# Patient Record
Sex: Male | Born: 1970 | Race: Black or African American | Hispanic: No | Marital: Single | Smoking: Never smoker
Health system: Southern US, Community
[De-identification: ages and names within clinical notes are randomized; demographics above are authoritative.]

---

## 2006-10-25 ENCOUNTER — Encounter: Admission: RE | Admit: 2006-10-25 | Discharge: 2006-10-25 | Payer: Self-pay | Admitting: General Practice

## 2012-04-18 ENCOUNTER — Encounter (HOSPITAL_COMMUNITY): Payer: Self-pay | Admitting: *Deleted

## 2012-04-18 ENCOUNTER — Emergency Department (HOSPITAL_COMMUNITY)
Admission: EM | Admit: 2012-04-18 | Discharge: 2012-04-18 | Disposition: A | Discharge status: Home / Self Care | Payer: Self-pay | Attending: Emergency Medicine | Admitting: Emergency Medicine

## 2012-04-18 ENCOUNTER — Emergency Department (HOSPITAL_COMMUNITY): Payer: Self-pay

## 2012-04-18 DIAGNOSIS — R079 Chest pain, unspecified: Secondary | ICD-10-CM | POA: Insufficient documentation

## 2012-04-18 DIAGNOSIS — M25512 Pain in left shoulder: Secondary | ICD-10-CM

## 2012-04-18 DIAGNOSIS — M25519 Pain in unspecified shoulder: Secondary | ICD-10-CM | POA: Insufficient documentation

## 2012-04-18 DIAGNOSIS — R209 Unspecified disturbances of skin sensation: Secondary | ICD-10-CM | POA: Insufficient documentation

## 2012-04-18 LAB — POCT I-STAT, CHEM 8
BUN: 15 mg/dL (ref 6–23)
Calcium, Ion: 1.22 mmol/L (ref 1.12–1.23)
Chloride: 104 mEq/L (ref 96–112)
Glucose, Bld: 92 mg/dL (ref 70–99)
HCT: 45 % (ref 39.0–52.0)
Potassium: 3.9 mEq/L (ref 3.5–5.1)

## 2012-04-18 LAB — POCT I-STAT TROPONIN I: Troponin i, poc: 0 ng/mL (ref 0.00–0.08)

## 2012-04-18 MED ORDER — OXYCODONE-ACETAMINOPHEN 5-325 MG PO TABS
1.0000 | ORAL_TABLET | Freq: Once | ORAL | Status: AC
  Administered 2012-04-18: 1 via ORAL
  Filled 2012-04-18: qty 1

## 2012-04-18 NOTE — ED Notes (Signed)
Pt reports that numbness/tingling is worse at night when he is sleeping. States that at times "it feels paralyzed."

## 2012-04-18 NOTE — ED Provider Notes (Signed)
History     CSN: [id number]  Arrival date & time 04/18/12  1023   First MD Initiated Contact with Patient 04/18/12 1113      Chief Complaint  Patient presents with  . left arm / lateral chest pain     The history is provided by the patient.   patient reports one half years of ongoing constant left shoulder pain.  His pain is improved with the hydrocodone prescribed by his physician but when he does not take the pain medication his left shoulder continues to bother him.  Denies swelling of his left upper extremity.  No fevers or chills.  There is radiation of his pain from his left shoulder down his left arm.  No rash or erythema noted.  No fevers or chills.  The patient has not been seen or evaluated by an orthopedic surgeon.  He continues to have his ibuprofen and hydrocodone at home.  No dense imaging has ever been performed of his left shoulder.  He points to his left anterior deltoid when describing the pain the  History reviewed. No pertinent past medical history.  History reviewed. No pertinent past surgical history.  No family history on file.  History  Substance Use Topics  . Smoking status: Not on file  . Smokeless tobacco: Not on file  . Alcohol Use: Not on file      Review of Systems  All other systems reviewed and are negative.    Allergies  Review of patient's allergies indicates no known allergies.  Home Medications  No current outpatient prescriptions on file.  BP 127/83  Pulse 89  Temp 98.2 F (36.8 C) (Oral)  Resp 18  SpO2 98%  Physical Exam  Nursing note and vitals reviewed. Constitutional: He is oriented to person, place, and time. He appears well-developed and well-nourished.  HENT:  Head: Normocephalic and atraumatic.  Eyes: EOM are normal.  Neck: Normal range of motion.  Cardiovascular: Normal rate, regular rhythm, normal heart sounds and intact distal pulses.   Pulmonary/Chest: Effort normal and breath sounds normal. No respiratory  distress.  Abdominal: Soft. He exhibits no distension. There is no tenderness.  Musculoskeletal:       Pain with range of motion of left shoulder.  He has inability to abduct his left shoulder passed 180 without some discomfort.  Normal left radial pulse.  No swelling of his left upper extremity.  No rash or swelling noted.  Neurological: He is alert and oriented to person, place, and time.  Skin: Skin is warm and dry.  Psychiatric: He has a normal mood and affect. Judgment normal.    ED Course  Procedures (including critical care time)  Labs Reviewed - No data to display Dg Shoulder Left  04/18/2012  *RADIOLOGY REPORT*  Clinical Data: Left shoulder pain  LEFT SHOULDER - 2+ VIEW  Comparison: None.  Findings: No evidence for fracture.  No findings to suggest shoulder separation or dislocation. No worrisome lytic or sclerotic osseous lesion.  IMPRESSION: No findings to explain the patient's history of pain.   Original Report Authenticated By: Kennith Center, M.D.     I personally reviewed the imaging tests through PACS system I reviewed available ER/hospitalization records through the EMR    Date: 04/18/2012  Rate: 88  Rhythm: normal sinus rhythm  QRS Axis: normal  Intervals: normal  ST/T Wave abnormalities: normal  Conduction Disutrbances: none  Narrative Interpretation:   Old EKG Reviewed: No significant changes noted     1. 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  Left shoulder films pending.  Pain treated.  Orthopedic followup.  The suspicion for ACS is very low        Lyanne Co, MD 04/18/12 1248

## 2012-04-18 NOTE — ED Notes (Signed)
Pt states he has had this left arm pain with left lateral chest pain before and had bottle of pain medication and states that this pain is worse.

## 2012-04-18 NOTE — ED Notes (Signed)
MD Campos at bedside.  

## 2012-06-15 ENCOUNTER — Other Ambulatory Visit (HOSPITAL_COMMUNITY): Payer: Self-pay | Admitting: Orthopedic Surgery

## 2012-06-15 DIAGNOSIS — M542 Cervicalgia: Secondary | ICD-10-CM

## 2012-06-17 ENCOUNTER — Ambulatory Visit (HOSPITAL_COMMUNITY)
Admission: RE | Admit: 2012-06-17 | Discharge: 2012-06-17 | Disposition: A | Discharge status: Home / Self Care | Payer: Self-pay | Source: Ambulatory Visit | Attending: Orthopedic Surgery | Admitting: Orthopedic Surgery

## 2012-06-17 DIAGNOSIS — M5030 Other cervical disc degeneration, unspecified cervical region: Secondary | ICD-10-CM | POA: Insufficient documentation

## 2012-06-17 DIAGNOSIS — M542 Cervicalgia: Secondary | ICD-10-CM

## 2012-06-17 DIAGNOSIS — M25519 Pain in unspecified shoulder: Secondary | ICD-10-CM | POA: Insufficient documentation

## 2012-06-17 DIAGNOSIS — M47812 Spondylosis without myelopathy or radiculopathy, cervical region: Secondary | ICD-10-CM | POA: Insufficient documentation

## 2012-07-14 DIAGNOSIS — M542 Cervicalgia: Secondary | ICD-10-CM | POA: Insufficient documentation

## 2013-07-24 ENCOUNTER — Ambulatory Visit: Payer: No Typology Code available for payment source | Attending: Internal Medicine

## 2013-07-27 ENCOUNTER — Ambulatory Visit (INDEPENDENT_AMBULATORY_CARE_PROVIDER_SITE_OTHER): Payer: Self-pay | Admitting: General Surgery

## 2013-08-10 ENCOUNTER — Encounter (INDEPENDENT_AMBULATORY_CARE_PROVIDER_SITE_OTHER): Payer: Self-pay | Admitting: General Surgery

## 2013-08-10 ENCOUNTER — Ambulatory Visit (INDEPENDENT_AMBULATORY_CARE_PROVIDER_SITE_OTHER): Payer: Self-pay | Admitting: General Surgery

## 2013-08-10 VITALS — BP 106/70 | HR 72 | Temp 97.7°F | Resp 16 | Ht 65.0 in | Wt 165.6 lb

## 2013-08-10 DIAGNOSIS — N4340 Spermatocele of epididymis, unspecified: Secondary | ICD-10-CM

## 2013-08-10 NOTE — Progress Notes (Signed)
Patient ID: Nathaniel Cruz, male   DOB: 1970-05-15, 43 y.o.   MRN: [id number]  Chief Complaint  Patient presents with  . New Evaluation    eval [facility]    HPI Nathaniel Cruz is a 43 y.o. male.  He is referred by Dr. Andi Devon for evaluation and management of a left scrotal mass, thought to be a hernia.  The patient states that he has had a lump in his left scrotum for 5 years. It has gotten larger and is now more painful. It never goes away. No prior history of hernia. No history of surgical problems in the past. No medical problems. Basically is healthy.  HPI  History reviewed. No pertinent past medical history.  History reviewed. No pertinent past surgical history.  No family history on file.  Social History History  Substance Use Topics  . Smoking status: Not on file  . Smokeless tobacco: Not on file  . Alcohol Use: Not on file    No Known Allergies  No current outpatient prescriptions on file.   No current facility-administered medications for this visit.    Review of Systems Review of Systems  Constitutional: Negative for fever, chills and unexpected weight change.  HENT: Negative for congestion, hearing loss, sore throat, trouble swallowing and voice change.   Eyes: Negative for visual disturbance.  Respiratory: Negative for cough and wheezing.   Cardiovascular: Negative for chest pain, palpitations and leg swelling.  Gastrointestinal: Negative for nausea, vomiting, abdominal pain, diarrhea, constipation, blood in stool, abdominal distention, anal bleeding and rectal pain.  Genitourinary: Positive for scrotal swelling. Negative for hematuria and difficulty urinating.  Musculoskeletal: Negative for arthralgias.  Skin: Negative for rash and wound.  Neurological: Negative for seizures, syncope, weakness and headaches.  Hematological: Negative for adenopathy. Does not bruise/bleed easily.  Psychiatric/Behavioral: Negative for confusion.    Blood pressure 106/70, pulse 72,  temperature 97.7 F (36.5 C), temperature source Temporal, resp. rate 16, height 5\' 5"  (1.651 m), weight 165 lb 9.6 oz (75.116 kg).  Physical Exam Physical Exam  Constitutional: He is oriented to person, place, and time. He appears well-developed and well-nourished. No distress.  HENT:  Head: Normocephalic.  Nose: Nose normal.  Mouth/Throat: No oropharyngeal exudate.  Eyes: Conjunctivae and EOM are normal. Pupils are equal, round, and reactive to light. Right eye exhibits no discharge. Left eye exhibits no discharge. No scleral icterus.  Neck: Normal range of motion. Neck supple. No JVD present. No tracheal deviation present. No thyromegaly present.  Cardiovascular: Normal rate, regular rhythm, normal heart sounds and intact distal pulses.   No murmur heard. Pulmonary/Chest: Effort normal and breath sounds normal. No stridor. No respiratory distress. He has no wheezes. He has no rales. He exhibits no tenderness.  Abdominal: Soft. Bowel sounds are normal. He exhibits no distension and no mass. There is no tenderness. There is no rebound and no guarding.  Genitourinary:  There is a 2 cm mass associated with the cord structures above the testicle. This is mobile and separate from the testicle. It is firm and consistent with a spermatocele. I do not feel an obvious inguinal hernia when standing. Right side feels normal.  Musculoskeletal: Normal range of motion. He exhibits no edema and no tenderness.  Lymphadenopathy:    He has no cervical adenopathy.  Neurological: He is alert and oriented to person, place, and time. He has normal reflexes. Coordination normal.  Skin: Skin is warm and dry. No rash noted. He is not diaphoretic. No erythema. No pallor.  Psychiatric: He has a normal mood and affect. His behavior is normal. Judgment and thought content normal.    Data Reviewed Office notes from Dr. Andi Devonloward  Assessment    Left spermatocele     Plan    Referred to urology for ultrasound  and management decisions.        Nathaniel MentionHaywood M Sesar Cruz 08/10/2013, 11:17 AM

## 2013-08-10 NOTE — Patient Instructions (Signed)
The lump in your left scrotum is most likely a benign tumor called a spermatocele.  Your testicle, is probably completely normal.  I do not feel an obvious hernia.  You'll be referred to a urologist for a scrotal ultrasound. They will decide whether you need an operation or not.    Scrotal Masses Scrotal swelling is common in men of all ages. Common types of testicular masses include:   Hydrocele. The most common benign testicular mass in an adult. Hydroceles are generally soft and painless collections of fluid in the scrotal sac. These can rapidly change size as the fluid enters or leaves. Hydroceles can be associated with an underlying cancer of the testicle.  Spermatoceles. Generally soft and painless cyst-like masses in the scrotum that contain fluid, usually above the testicle. They can rapidly change size as the fluid enters or leaves. They are more prominent while standing or exercising. Sometimes, spermatoceles may cause a sensation of heaviness or a dull ache.  Orchitis. Inflammation of the testicle. It is painful and may be associated with a fever or symptoms of a urinary tract infection, including frequent and painful urination. It is common in males who have the mumps.  Varicocele. An enlargement of the veins that drain the testicles. Varicoceles usually occur on the left side of the scrotum. This condition can increase the risk of infertility. Varicocele is sometimes more prominent while standing or exercising. Sometimes, varicoceles may cause a sensation of heaviness or a dull ache.  Inguinal hernia. A bulge caused by a portion of intestine protruding into the scrotum through a weak area in the abdominal muscles. Hernias may or may not be painful. They are soft and usually enlarge with coughing or straining.  Torsion of the testis. This can cause a testicular mass that develops quickly and is associated with tenderness or fever, or both. It is caused by a twisting of the testicle  within the sac. It also reduces the blood supply and can destroy the testis if not treated quickly with surgery.  Epididymitis. Inflammation of the epididymis (a structure attached above and behind the testicle), usually caused by a urinary tract infection or a sexually transmitted infection. This generally shows up as testicular discomfort and swelling and may include pain during urination. It is frequently associated with a testicle infection.  Testicular appendages. Remnants of tissue on the testis present since birth. A testicular appendage can twist on its blood supply and cause pain. In most cases, this is seen as a blue dot on the scrotum.  Hematocele. A collection of blood between the layers of the sac inside the scrotum. It usually is caused by trauma to the scrotum.  Sebaceous cysts. These can be a swelling in the skin of the scrotum and are usually painless.  Cancer (carcinoma) of the skin of the scrotum. It can cause scrotal swelling, but this is rare. Document Released: 09/06/2002 Document Revised: 11/02/2012 Document Reviewed: 08/22/2012 [facility] Patient Information 2014 [place], [place].

## 2013-08-11 ENCOUNTER — Ambulatory Visit: Payer: No Typology Code available for payment source | Attending: Internal Medicine

## 2013-09-18 ENCOUNTER — Encounter (INDEPENDENT_AMBULATORY_CARE_PROVIDER_SITE_OTHER): Payer: Self-pay

## 2013-09-21 ENCOUNTER — Ambulatory Visit (INDEPENDENT_AMBULATORY_CARE_PROVIDER_SITE_OTHER): Payer: No Typology Code available for payment source | Admitting: Internal Medicine

## 2013-09-21 ENCOUNTER — Encounter: Payer: Self-pay | Admitting: Internal Medicine

## 2013-09-21 VITALS — BP 108/72 | HR 60 | Temp 98.0°F | Resp 20 | Ht 65.0 in | Wt 160.0 lb

## 2013-09-21 DIAGNOSIS — R52 Pain, unspecified: Secondary | ICD-10-CM

## 2013-09-21 DIAGNOSIS — Z0000 Encounter for general adult medical examination without abnormal findings: Secondary | ICD-10-CM

## 2013-09-21 DIAGNOSIS — N4340 Spermatocele of epididymis, unspecified: Secondary | ICD-10-CM

## 2013-09-21 MED ORDER — OXYCODONE HCL 5 MG PO CAPS: 5.0000 mg | ORAL_CAPSULE | ORAL | Status: DC | PRN

## 2013-09-22 ENCOUNTER — Telehealth (HOSPITAL_COMMUNITY): Payer: Self-pay | Admitting: *Deleted

## 2013-09-22 LAB — CBC WITH DIFFERENTIAL/PLATELET
BASOS PCT: 0 % (ref 0–1)
Basophils Absolute: 0 10*3/uL (ref 0.0–0.1)
EOS ABS: 0.2 10*3/uL (ref 0.0–0.7)
EOS PCT: 4 % (ref 0–5)
HEMATOCRIT: 40 % (ref 39.0–52.0)
HEMOGLOBIN: 13.9 g/dL (ref 13.0–17.0)
Lymphocytes Relative: 57 % — ABNORMAL HIGH (ref 12–46)
Lymphs Abs: 2.3 10*3/uL (ref 0.7–4.0)
MCH: 27.7 pg (ref 26.0–34.0)
MCHC: 34.8 g/dL (ref 30.0–36.0)
MCV: 79.7 fL (ref 78.0–100.0)
MONO ABS: 0.4 10*3/uL (ref 0.1–1.0)
MONOS PCT: 9 % (ref 3–12)
Neutro Abs: 1.2 10*3/uL — ABNORMAL LOW (ref 1.7–7.7)
Neutrophils Relative %: 30 % — ABNORMAL LOW (ref 43–77)
Platelets: 173 10*3/uL (ref 150–400)
RBC: 5.02 MIL/uL (ref 4.22–5.81)
RDW: 13.9 % (ref 11.5–15.5)
WBC: 4.1 10*3/uL (ref 4.0–10.5)

## 2013-09-22 LAB — COMPREHENSIVE METABOLIC PANEL
ALK PHOS: 68 U/L (ref 39–117)
ALT: 18 U/L (ref 0–53)
AST: 21 U/L (ref 0–37)
Albumin: 4.2 g/dL (ref 3.5–5.2)
BILIRUBIN TOTAL: 0.9 mg/dL (ref 0.2–1.2)
BUN: 9 mg/dL (ref 6–23)
CO2: 26 meq/L (ref 19–32)
Calcium: 9.2 mg/dL (ref 8.4–10.5)
Chloride: 105 mEq/L (ref 96–112)
Creat: 0.94 mg/dL (ref 0.50–1.35)
GLUCOSE: 99 mg/dL (ref 70–99)
Potassium: 4.7 mEq/L (ref 3.5–5.3)
Sodium: 138 mEq/L (ref 135–145)
Total Protein: 7.1 g/dL (ref 6.0–8.3)

## 2013-09-22 LAB — URINALYSIS
BILIRUBIN URINE: NEGATIVE
GLUCOSE, UA: NEGATIVE mg/dL
HGB URINE DIPSTICK: NEGATIVE
KETONES UR: NEGATIVE mg/dL
Leukocytes, UA: NEGATIVE
NITRITE: NEGATIVE
PH: 8 (ref 5.0–8.0)
Protein, ur: NEGATIVE mg/dL
Specific Gravity, Urine: 1.02 (ref 1.005–1.030)
Urobilinogen, UA: 1 mg/dL (ref 0.0–1.0)

## 2013-09-22 LAB — LIPID PANEL
CHOLESTEROL: 161 mg/dL (ref 0–200)
HDL: 48 mg/dL (ref >39)
LDL CALC: 102 mg/dL — AB (ref 0–99)
TRIGLYCERIDES: 56 mg/dL (ref <150)
Total CHOL/HDL Ratio: 3.4 Ratio
VLDL: 11 mg/dL (ref 0–40)

## 2013-09-22 LAB — VITAMIN D 25 HYDROXY (VIT D DEFICIENCY, FRACTURES): Vit D, 25-Hydroxy: 29 ng/mL — ABNORMAL LOW (ref 30–89)

## 2013-09-22 LAB — TSH: TSH: 0.385 u[IU]/mL (ref 0.350–4.500)

## 2013-09-22 NOTE — Telephone Encounter (Addendum)
Patient presents to sickle cell clinic with questions concerning his medication. Patient has oxycodone 5 mg capsules and patient inquiring if this is the correct medication as it looks different. Also per patient, MD instruction was to take half a pill every 4 hours as needed. Notified MD. Per MD, Patient is to take 1 capsule every 6-8 hours as needed. Instructions given to patient, patient verbalizes understanding using teach back method. Informed patient if need to verify if the pill is the correct medication to clarify with his pharmacy. Patient acknowledges.

## 2013-10-02 ENCOUNTER — Telehealth: Payer: Self-pay | Admitting: Internal Medicine

## 2013-10-02 ENCOUNTER — Ambulatory Visit (INDEPENDENT_AMBULATORY_CARE_PROVIDER_SITE_OTHER): Payer: No Typology Code available for payment source | Admitting: Family Medicine

## 2013-10-02 ENCOUNTER — Encounter: Payer: Self-pay | Admitting: Family Medicine

## 2013-10-02 ENCOUNTER — Telehealth: Payer: Self-pay | Admitting: Family Medicine

## 2013-10-02 VITALS — BP 111/74 | HR 73 | Temp 98.5°F | Resp 16 | Ht 65.0 in | Wt 161.0 lb

## 2013-10-02 DIAGNOSIS — R52 Pain, unspecified: Secondary | ICD-10-CM

## 2013-10-02 DIAGNOSIS — N433 Hydrocele, unspecified: Secondary | ICD-10-CM

## 2013-10-02 DIAGNOSIS — N4340 Spermatocele of epididymis, unspecified: Secondary | ICD-10-CM

## 2013-10-02 DIAGNOSIS — R1032 Left lower quadrant pain: Secondary | ICD-10-CM | POA: Insufficient documentation

## 2013-10-02 DIAGNOSIS — R109 Unspecified abdominal pain: Secondary | ICD-10-CM

## 2013-10-02 MED ORDER — IBUPROFEN 600 MG PO TABS: 600.0000 mg | ORAL_TABLET | Freq: Three times a day (TID) | ORAL | Status: DC | PRN

## 2013-10-02 MED ORDER — OXYCODONE HCL 5 MG PO CAPS: 5.0000 mg | ORAL_CAPSULE | Freq: Four times a day (QID) | ORAL | Status: DC | PRN

## 2013-10-02 NOTE — Patient Instructions (Addendum)
Take Ibuprofen 600 every 8 hours as needed for mild to moderate pain. Take with food!!! Start Oxycodone 5 mg every 6 hours as needed for moderate to severe pain.  Start OTC Vitamin D Supplement 1000 IU daily Recommend athletic scrotal support or "jock strap"

## 2013-10-02 NOTE — Telephone Encounter (Signed)
Scheduled work-in appointment for [facility] on 10/03/2013 at 9am.  [facility] is symptoms worsen.  Patient reports that pain intensity has decreased after starting Ibuprofen 600 mg this am. Patient to follow up with Dr. Ashley RoyaltyMatthews as scheduled

## 2013-10-02 NOTE — Telephone Encounter (Signed)
Patient walked in to office today to advise the pain medication is not working. Unable to sleep last night.

## 2013-10-02 NOTE — Progress Notes (Signed)
Subjective:    Patient ID: Nathaniel Cruz, male    DOB: [birth year], 43 y.o.   MRN: [id number]  HPI Patient was in the office to establish care on 09/21/2013.   Patient presents for evaluation of left testicle. Patient notified the office of worsening testicular pain. He reports an 8/10 dull pain to left testicle. He reports that he has had testicular pain and discomfort for 4 years. Reports that he has been evaluated for this condition on several occasions. He states that he was evaluated by Dr. Derrell Lolling in May, who referred him to [facility]. Patient was evaluated by Dr. Margarita Grizzle, who suggested surgical intervention for a hydrocele of the left spermatic cord. Patient was not interested in surgical intervention at that time. Patient states that symptoms are worsening and the pain medication is not working as well. He last had Oxycodone 5 mg on 7/19 around 6 pm with minimal relief. Patient denies strenuous activities, changing sex partners, or extreme exercising. . Pt has no symptoms of chronic constipation, chronic cough. Reports urinary frequency and urgency. Patient denies a history of groin surgery or hernia.    Review of Systems  Constitutional: Positive for fatigue.  HENT: Negative.   Eyes: Negative.   Respiratory: Negative.   Cardiovascular: Negative.   Gastrointestinal: Negative.   Endocrine: Negative.   Genitourinary: Positive for testicular pain.  Skin: Negative.   Allergic/Immunologic: Negative.   Neurological: Negative.   Hematological: Negative.   Psychiatric/Behavioral: Negative.        Objective:   Physical Exam  Constitutional: He is oriented to person, place, and time. He appears well-developed and well-nourished.  HENT:  Head: Normocephalic.  Right Ear: External ear normal.  Eyes: Conjunctivae and lids are normal. Pupils are equal, round, and reactive to light.  Cardiovascular: Normal rate, regular rhythm and normal heart sounds.   Pulmonary/Chest: Effort normal  and breath sounds normal.  Abdominal: Soft. Bowel sounds are normal. There is tenderness in the right lower quadrant and left lower quadrant. There is no CVA tenderness. A hernia is present.  Genitourinary:    Right testis shows mass. Left testis shows swelling and tenderness. Cremasteric reflex is not absent on the left side.  Musculoskeletal: He exhibits edema.  Neurological: He is alert and oriented to person, place, and time. He has normal reflexes.  Skin: Skin is warm and dry.  Psychiatric: He has a normal mood and affect. His behavior is normal. Thought content normal.         BP 111/74  Pulse 73  Temp(Src) 98.5 F (36.9 C) (Oral)  Resp 16  Ht 5\' 5"  (1.651 m)  Wt 161 lb (73.029 kg)  BMI 26.79 kg/m2 Assessment & Plan:  1. Left testicular pain: Patient reports that he has been experiencing pain to the left testicle and a left scrotal mass for 4 years. Evaluated labs from 7/9 and patient is not experiencing hematuria. He reports that the size remains large and is uncomfortable to touch. He states that the pain is slowly worsening. He states that the pain started to increase last night. Started Ibuprofen 600 every 6 hours for pain. Patient is to continue Oxycodone 5 mg every 6 hours for mild to moderate pain # 30. Recommend increased scrotal support  Notified [facility] to schedule a work-in appointment. He is advised to follow up in the [facility] department is pain worsens. Reviewed labs from 09/21/2013.   2. Vitamin D deficiency: Patient to start Vitamin D 1000 IU daily  RTC: As previously scheduled   Mulan Adan M

## 2013-10-02 NOTE — Telephone Encounter (Signed)
Armeniahina, FNP spoke with patient and reviewed chart and we are going to see him as a walk in today.

## 2013-10-02 NOTE — Telephone Encounter (Signed)
Spoke with patient he states he is having pain and swelling in left groin area. Does not have an appointment until 11/27/2013. Will advise with Armeniahina if patient can be seen sooner or needs to be sent to er.

## 2013-10-03 ENCOUNTER — Ambulatory Visit: Payer: No Typology Code available for payment source | Admitting: Family Medicine

## 2013-10-05 ENCOUNTER — Other Ambulatory Visit (HOSPITAL_COMMUNITY): Payer: Self-pay | Admitting: Urology

## 2013-10-05 DIAGNOSIS — N433 Hydrocele, unspecified: Secondary | ICD-10-CM

## 2013-10-09 ENCOUNTER — Ambulatory Visit (HOSPITAL_COMMUNITY)
Admission: RE | Admit: 2013-10-09 | Discharge: 2013-10-09 | Disposition: A | Discharge status: Home / Self Care | Payer: Self-pay | Source: Ambulatory Visit | Attending: Urology | Admitting: Urology

## 2013-10-09 ENCOUNTER — Encounter (HOSPITAL_COMMUNITY): Payer: Self-pay

## 2013-10-09 DIAGNOSIS — R1900 Intra-abdominal and pelvic swelling, mass and lump, unspecified site: Secondary | ICD-10-CM | POA: Insufficient documentation

## 2013-10-09 DIAGNOSIS — K449 Diaphragmatic hernia without obstruction or gangrene: Secondary | ICD-10-CM | POA: Insufficient documentation

## 2013-10-09 DIAGNOSIS — R109 Unspecified abdominal pain: Secondary | ICD-10-CM | POA: Insufficient documentation

## 2013-10-09 DIAGNOSIS — N433 Hydrocele, unspecified: Secondary | ICD-10-CM | POA: Insufficient documentation

## 2013-10-09 MED ORDER — IOHEXOL 300 MG/ML  SOLN
100.0000 mL | Freq: Once | INTRAMUSCULAR | Status: AC | PRN
  Administered 2013-10-09: 100 mL via INTRAVENOUS

## 2013-10-26 NOTE — Progress Notes (Signed)
Patient ID: Nathaniel Cruz, male   DOB: 03/06/1971, 43 y.o.   MRN: [id number]   Nathaniel Cruz, is a 43 y.o. male  HQI:[id number]:[id number]  XLK:[id number]  DOB - 11/12/1970  CC:  Chief Complaint  Patient presents with  . Establish Care    Pt has been having Lt Groin pain x's 1 month. Pt states 8/10 on pain scale at thin time. Pt was also taking  Naproxen as well as Doxycycline but stoped, due to stomach issues that came along with it.       HPI: Nathaniel Mousessa Scardino is a 43 y.o. male here today to establish medical care. He has been having left groin pain which he states is 3/10 today. He states that at times it increases to sa high as 8/10. He has had no difficulty urinating and has no testicular pain. He otherwise has no other complaints. Patient has No headache, No chest pain, No abdominal pain - No Nausea, No new weakness tingling or numbness, No Cough - SOB.  Allergies  Allergen Reactions  . Naproxen Other (See Comments)    Stomach Aches   History reviewed. No pertinent past medical history. No current outpatient prescriptions on file prior to visit.   No current facility-administered medications on file prior to visit.   History reviewed. No pertinent family history. History   Social History  . Marital Status: Single    Spouse Name: N/A    Number of Children: N/A  . Years of Education: N/A   Occupational History  . Not on file.   Social History Main Topics  . Smoking status: Never Smoker   . Smokeless tobacco: Never Used  . Alcohol Use: No  . Drug Use: No  . Sexual Activity: No   Other Topics Concern  . Not on file   Social History Narrative  . No narrative on file    Review of Systems: Constitutional: Negative for fever, chills, diaphoresis, activity change, appetite change and fatigue. HENT: Negative for ear pain, nosebleeds, congestion, facial swelling, rhinorrhea, neck pain, neck stiffness and ear discharge.  Eyes: Negative for pain, discharge, redness, itching and visual  disturbance. Respiratory: Negative for cough, choking, chest tightness, shortness of breath, wheezing and stridor.  Cardiovascular: Negative for chest pain, palpitations and leg swelling. Gastrointestinal: Negative for abdominal distention. Genitourinary: Negative for dysuria, urgency, frequency, hematuria, flank pain, decreased urine volume, difficulty urinating and dyspareunia.  Musculoskeletal: Negative for back pain, joint swelling, arthralgia and gait problem. Neurological: Negative for dizziness, tremors, seizures, syncope, facial asymmetry, speech difficulty, weakness, light-headedness, numbness and headaches.  Hematological: Negative for adenopathy. Does not bruise/bleed easily. Psychiatric/Behavioral: Negative for hallucinations, behavioral problems, confusion, dysphoric mood, decreased concentration and agitation.    Objective:         Filed Vitals:   09/21/13 1116  BP: 108/72  Pulse: 60  Temp: 98 F (36.7 C)  Resp: 20    Physical Exam: Constitutional: Patient appears well-developed and well-nourished. No distress. HENT: Normocephalic, atraumatic, External right and left ear normal. Oropharynx is clear and moist.  Eyes: Conjunctivae and EOM are normal. PERRLA, no scleral icterus. Neck: Normal ROM. Neck supple. No JVD. No tracheal deviation. No thyromegaly. CVS: RRR, S1/S2 +, no murmurs, no gallops, no carotid bruit.  Pulmonary: Effort and breath sounds normal, no stridor, rhonchi, wheezes, rales.  Abdominal: Soft. BS +, no distension, tenderness, rebound or guarding.  Musculoskeletal: Normal range of motion. No edema and no tenderness.  Lymphadenopathy: No lymphadenopathy noted, cervical, inguinal or axillary Neuro: Alert. Normal  reflexes, muscle tone coordination. No cranial nerve deficit. Skin: Skin is warm and dry. No rash noted. Not diaphoretic. No erythema. No pallor. Genitalia: Pt has non-pulsating mass in the left groin region which is non-tender to  palpation. Psychiatric: Normal mood and affect. Behavior, judgment, thought content normal.  Lab Results  Component Value Date   WBC 4.1 09/21/2013   HGB 13.9 09/21/2013   HCT 40.0 09/21/2013   MCV 79.7 09/21/2013   PLT 173 09/21/2013   Lab Results  Component Value Date   CREATININE 0.94 09/21/2013   BUN 9 09/21/2013   NA 138 09/21/2013   K 4.7 09/21/2013   CL 105 09/21/2013   CO2 26 09/21/2013    No results found for this basename: HGBA1C   Lipid Panel     Component Value Date/Time   CHOL 161 09/21/2013 1300   TRIG 56 09/21/2013 1300   HDL 48 09/21/2013 1300   CHOLHDL 3.4 09/21/2013 1300   VLDL 11 09/21/2013 1300   LDLCALC 102* 09/21/2013 1300       Assessment and plan:   1. Spermatocele -  Oxycodone 2.5 - 5mg   q 4 hours PRN pain - Ambulatory referral to Urology - Urinalysis  2. Pain - Prescribed Oxycodone 2.5 - 5mg   q 4 hours PRN pain  3. Annual physical exam - Labs performed today and patient to return in 1 moth for CPE - CBC with Differential - Comprehensive metabolic panel - Lipid panel - Vit D  25 hydroxy (rtn osteoporosis monitoring) - TSH   Return in about 2 months (around 11/22/2013) for Annual Physical, spermatocoele.  The patient was given clear instructions to go to ER or return to medical center if symptoms don't improve, worsen or new problems develop. The patient verbalized understanding. The patient was told to call to get lab results if they haven't heard anything in the next week.     This note has been created with Education officer, environmental. Any transcriptional errors are unintentional.    Surena Welge A., MD [facility] [place], [place] [phone]   10/26/2013, 6:42 PM

## 2013-11-27 ENCOUNTER — Encounter: Payer: No Typology Code available for payment source | Admitting: Internal Medicine

## 2015-03-15 IMAGING — CT CT PELVIS W/ CM
2 of 4 series · 17 of 46 positions shown, 19 images · IV contrast (OMNIPAQUE)
Comparison: None.

CLINICAL DATA: Evaluate for left inguinal mass. Left groin pain
with swelling. No prior surgeries. Hydrocele.

EXAM:
CT PELVIS WITH CONTRAST
TECHNIQUE: Multidetector CT imaging of the pelvis was performed using the
standard protocol following the bolus administration of intravenous
contrast.
CONTRAST:  100mL OMNIPAQUE IOHEXOL 300 MG/ML  SOLN

[Series 4: pelvis st · axial · 0.74mm/px · z∈[+782,+1022]mm · 14 of 54 slices shown, 16 images]
[im 3/54  soft-tissue]
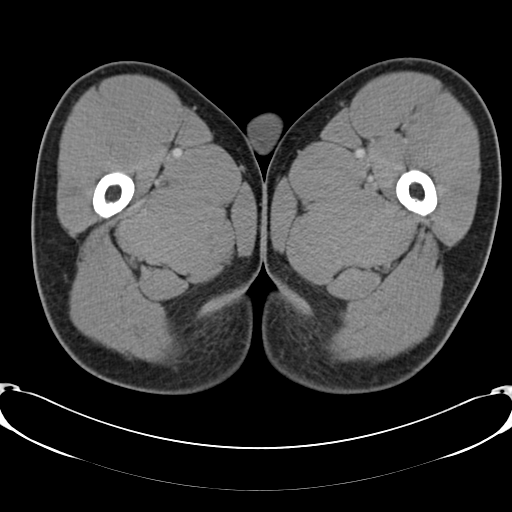
[im 3/54  bone]
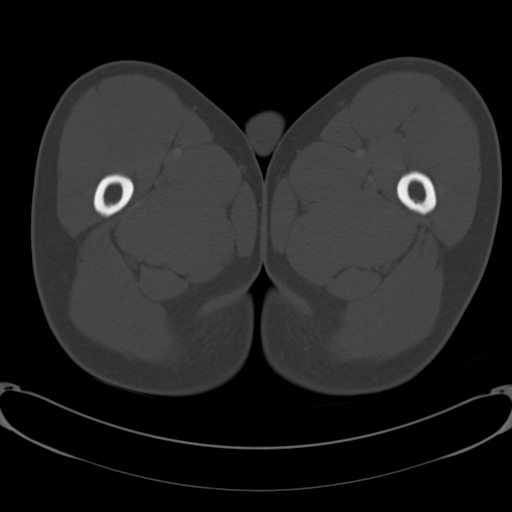
[im 7/54  soft-tissue]
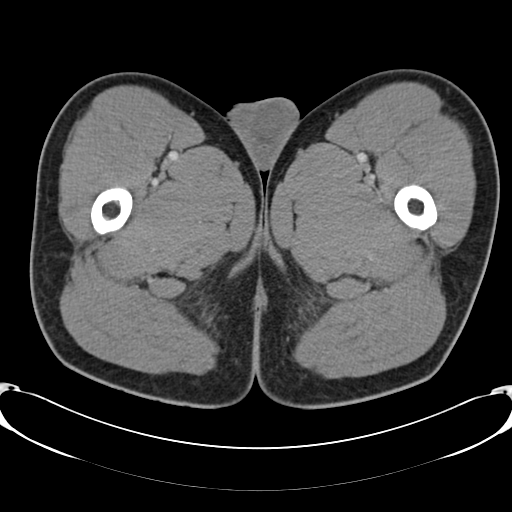
[im 11/54  soft-tissue]
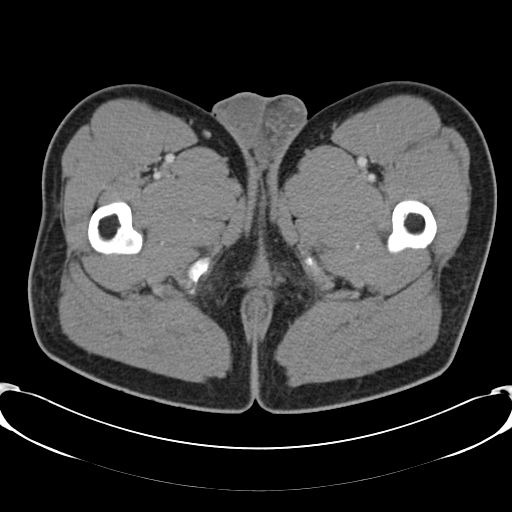
[im 15/54  soft-tissue]
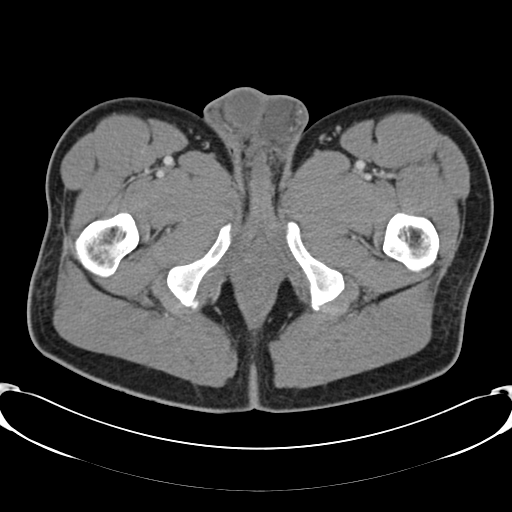
[im 17/54  soft-tissue]
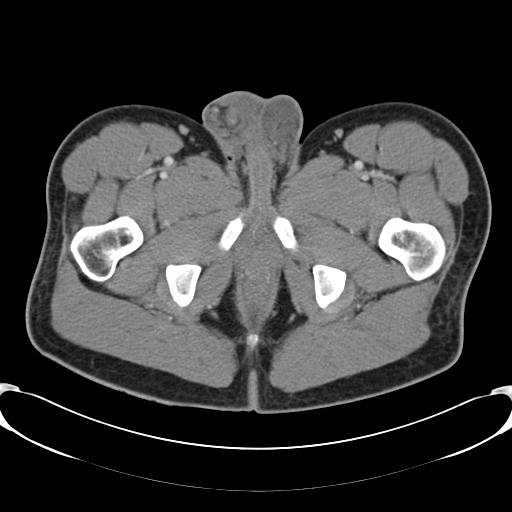
[im 22/54  soft-tissue]
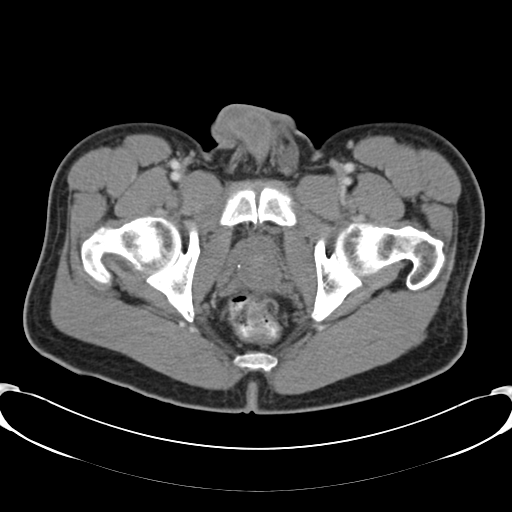
[im 26/54  soft-tissue]
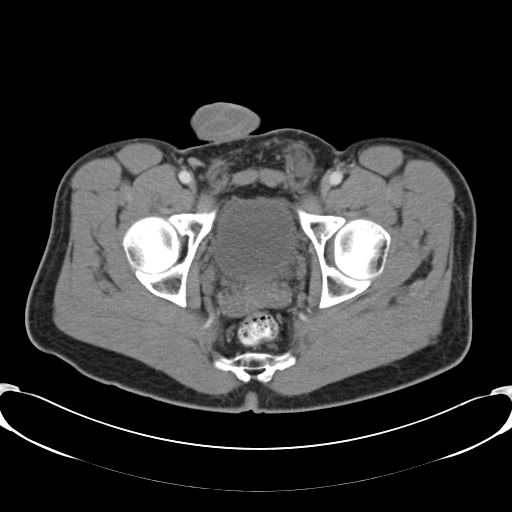
[im 28/54  soft-tissue]
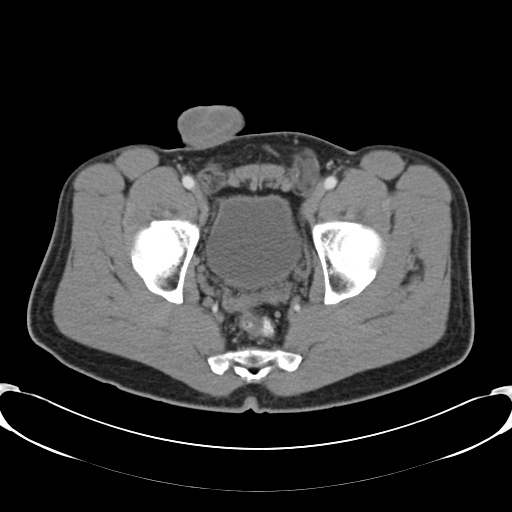
[im 32/54  soft-tissue]
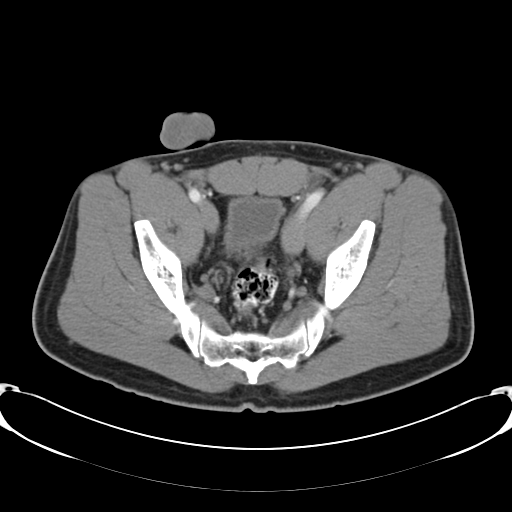
[im 32/54  bone]
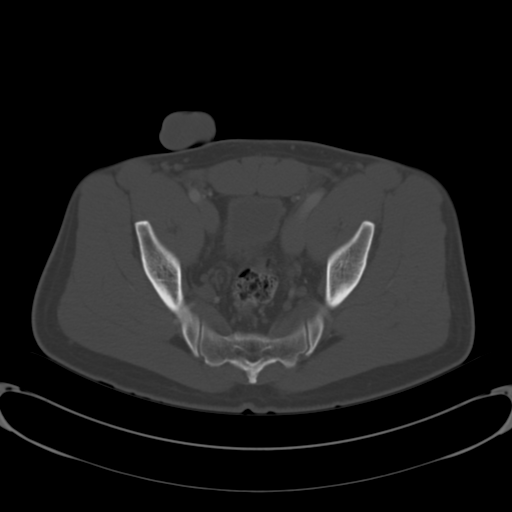
[im 37/54  soft-tissue]
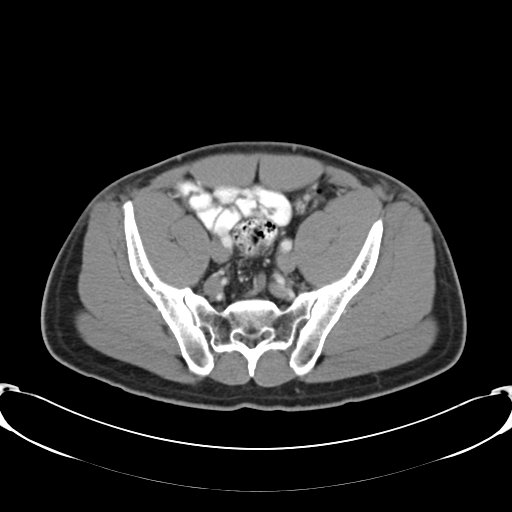
[im 41/54  soft-tissue]
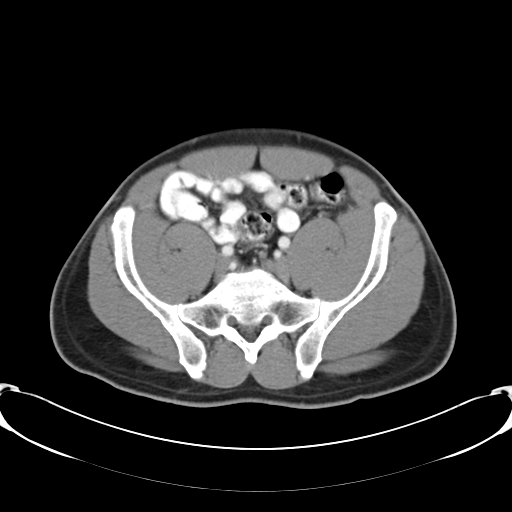
[im 43/54  soft-tissue]
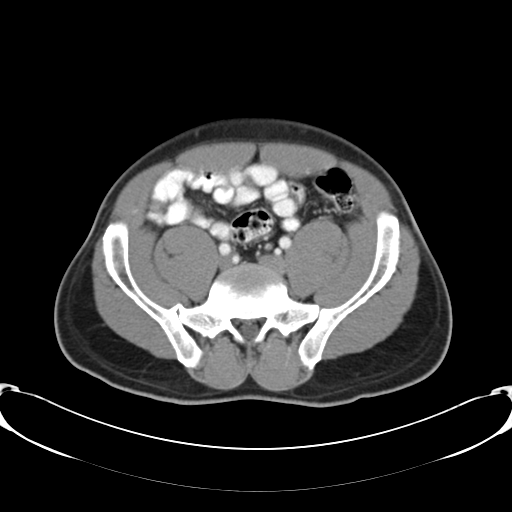
[im 47/54  soft-tissue]
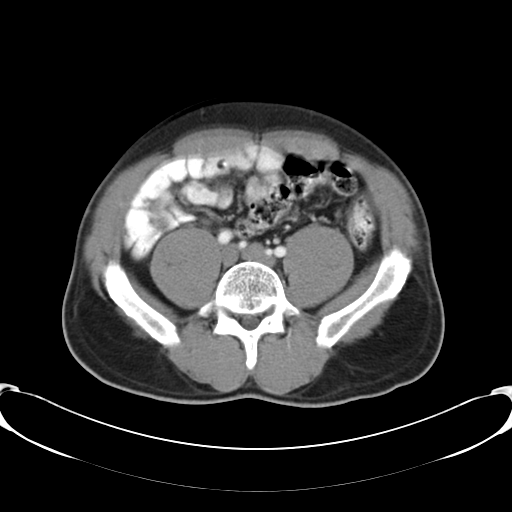
[im 51/54  soft-tissue]
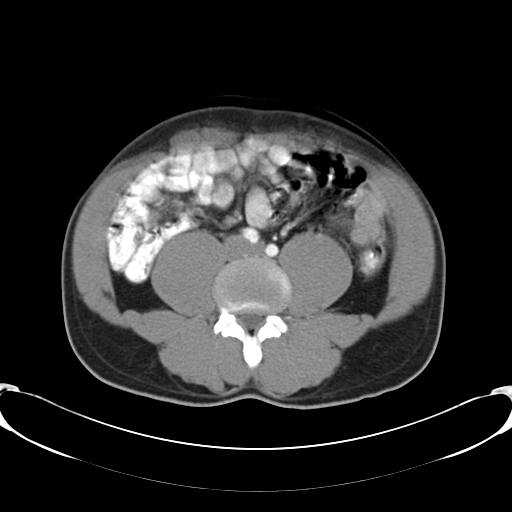

[Series 602: <mpr thick range> · coronal · 0.74mm/px · 3 of 79 slices shown]
[im 27/79  soft-tissue]
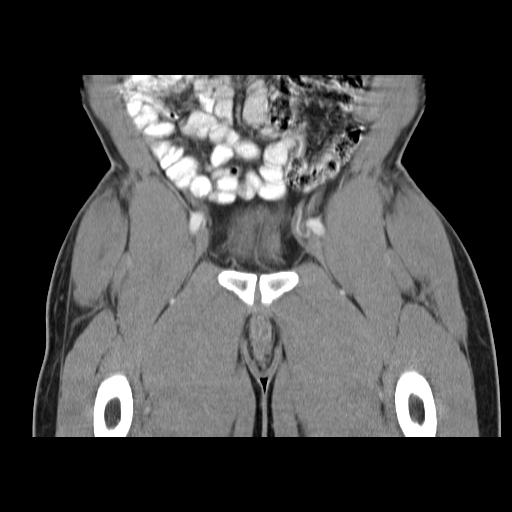
[im 35/79  soft-tissue]
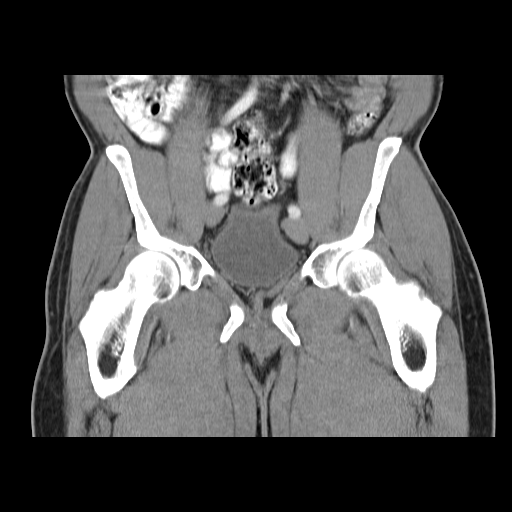
[im 44/79  soft-tissue]
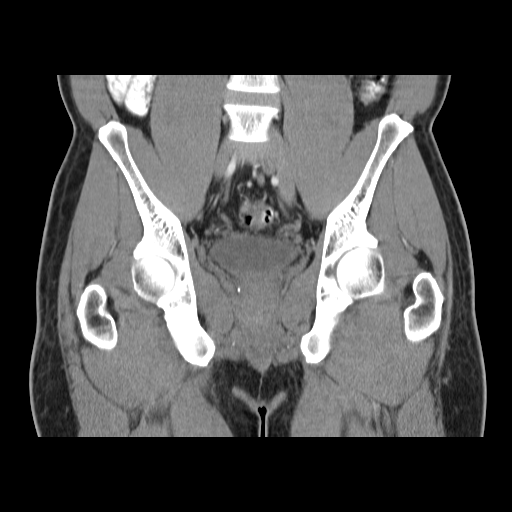

[17 of 46 positions shown; findings below may reference images not displayed]

FINDINGS: Normal pelvic bowel loops. No pelvic adenopathy. Normal urinary
bladder and prostate. No significant free fluid. A moderate left
inguinal hernia contains fat and a small amount of fluid. Example
image 27/series 4. Trace bilateral scrotal fluid, likely
physiologic.

No acute osseous abnormality.
IMPRESSION: Left inguinal hernia containing fat and fluid.

## 2017-03-18 ENCOUNTER — Encounter (HOSPITAL_COMMUNITY): Payer: Self-pay | Admitting: Emergency Medicine

## 2017-03-18 ENCOUNTER — Emergency Department (HOSPITAL_COMMUNITY)
Admission: EM | Admit: 2017-03-18 | Discharge: 2017-03-18 | Disposition: A | Discharge status: Home / Self Care | Payer: No Typology Code available for payment source | Attending: Emergency Medicine | Admitting: Emergency Medicine

## 2017-03-18 ENCOUNTER — Emergency Department (HOSPITAL_COMMUNITY): Payer: No Typology Code available for payment source

## 2017-03-18 DIAGNOSIS — Z886 Allergy status to analgesic agent status: Secondary | ICD-10-CM | POA: Diagnosis not present

## 2017-03-18 DIAGNOSIS — S20212A Contusion of left front wall of thorax, initial encounter: Secondary | ICD-10-CM | POA: Insufficient documentation

## 2017-03-18 DIAGNOSIS — Y92410 Unspecified street and highway as the place of occurrence of the external cause: Secondary | ICD-10-CM | POA: Diagnosis not present

## 2017-03-18 DIAGNOSIS — Y999 Unspecified external cause status: Secondary | ICD-10-CM | POA: Insufficient documentation

## 2017-03-18 DIAGNOSIS — Y9389 Activity, other specified: Secondary | ICD-10-CM | POA: Insufficient documentation

## 2017-03-18 DIAGNOSIS — S20302A Unspecified superficial injuries of left front wall of thorax, initial encounter: Secondary | ICD-10-CM | POA: Diagnosis present

## 2017-03-18 MED ORDER — CYCLOBENZAPRINE HCL 10 MG PO TABS: 10.0000 mg | ORAL_TABLET | Freq: Two times a day (BID) | ORAL | 0 refills | Status: DC | PRN

## 2017-03-18 MED ORDER — CYCLOBENZAPRINE HCL 10 MG PO TABS
10.0000 mg | ORAL_TABLET | Freq: Once | ORAL | Status: AC
  Administered 2017-03-18: 10 mg via ORAL
  Filled 2017-03-18: qty 1
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
## 2017-03-18 NOTE — ED Notes (Signed)
Patient c/o left rib cage pain that is worse with movement and palpation. Denies pain any where else at this time

## 2017-03-18 NOTE — Discharge Instructions (Signed)
You can take tylenol and ibuprofen in addition to the medication we give you. Follow up with your doctor in the next few day. Return here if you have problems such as shortness of breath, persistent vomiting, increased pain or fever.

## 2021-04-10 ENCOUNTER — Encounter: Payer: Self-pay | Admitting: Physician Assistant

## 2022-01-29 ENCOUNTER — Encounter: Payer: Self-pay | Admitting: Nurse Practitioner

## 2022-02-13 LAB — LAB REPORT - SCANNED: EGFR: 96

## 2022-03-04 ENCOUNTER — Ambulatory Visit (INDEPENDENT_AMBULATORY_CARE_PROVIDER_SITE_OTHER): Payer: No Typology Code available for payment source | Admitting: Nurse Practitioner

## 2022-03-04 ENCOUNTER — Encounter: Payer: Self-pay | Admitting: Nurse Practitioner

## 2022-03-04 VITALS — BP 102/58 | HR 62 | Ht 65.0 in | Wt 165.0 lb

## 2022-03-04 DIAGNOSIS — K8020 Calculus of gallbladder without cholecystitis without obstruction: Secondary | ICD-10-CM

## 2022-03-04 DIAGNOSIS — R1011 Right upper quadrant pain: Secondary | ICD-10-CM

## 2022-03-04 DIAGNOSIS — Z1211 Encounter for screening for malignant neoplasm of colon: Secondary | ICD-10-CM

## 2022-03-04 MED ORDER — PANTOPRAZOLE SODIUM 40 MG PO TBEC: 40.0000 mg | DELAYED_RELEASE_TABLET | Freq: Every day | ORAL | 1 refills | Status: AC

## 2022-03-04 NOTE — Patient Instructions (Addendum)
Nous avons envoy les mdicaments suivants  votre pharmacie pour que vous puissiez les rcuprer  votre convenance : Pantoprazole 40mg  (prendre 1 par par [employer])  [employer] programme d'aide financire [employer].  Contactez notre bureau lorsque vous tes prt  planifier une coloscopie et une endoscopie haute.  Thank you for trusting me with your gastrointestinal care!   [employer], CRNP

## 2022-03-04 NOTE — Progress Notes (Signed)
03/04/2022 Nathaniel Cruz [id number] 09/10/1970   CHIEF COMPLAINT: RUQ pain, gallstones   HISTORY OF PRESENT ILLNESS: Nathaniel Cruz is a 51 year old male with a past medical history of gallstones. No past surgical history. He presents to our office today as referred by Barry Brunner PA-C for further evaluation regarding RUQ pain.  Speaks [place] therefore he is accompanied by [facility] [place] interpreter to facilitate communication throughout today's consult.  He complains of having RUQ pain which started 6 to 7 years ago.  Fatty/oily foods and dairy products worsen his RUQ pain.  His RUQ pain is more noticeable at nighttime and worsens if he sleeps on his left side.  He denies having any nausea or vomiting.  No dysphagia or heartburn.  No lower abdominal pain. He takes Advil/Ibuprofen 200mg  two tabs once or twice daily as needed, not on a consistent basis.  He passes a formed bowel movement most days, he does not really look at his stool but he denies seeing any obvious red blood or black stools. No fevers, night sweats or weight loss. He denies ever having an EGD or colonoscopy.  A right upper quadrant abdominal sonogram 02/13/2021 identified a 1.2 cm gallstone at the neck of the gallbladder which was nonmobile without evidence of cholecystitis or CBD dilatation.  He reported undergoing labs by his PCP on 02/12/2022 T. bili use were normal.  Our office will request copy of his labs for further review.  He denies ever having an upper endoscopy or screening colonoscopy.  No known family history of esophageal, gastric or colorectal cancer.  No other complaints at this time.  IMAGE STUDIES:  RUQ sono 02/13/2021: PANCREAS:  No focal abnormalities are identified.  Visualization is limited.   VASCULATURE:  No abdominal aortic aneurysm.  Visualized IVC is patent.  Portal vein is patent with normal flow direction.   HEPATOBILIARY:  Liver: Normal.   Gallbladder: A 1.2 cm gallstone is present at the  gallbladder neck, nonmobile. No gallbladder wall thickening or pericholecystic fluid are present. Negative sonographic 14/03/2020 sign reported.  CBD: Normal.  No intrahepatic biliary ductal dilatation.   KIDNEYS:  Both kidneys are normal in size.  No hydronephrosis.  No suspicious masses.  Normal renal echotexture.   SPLEEN:  Size is within normal limits.  No focal abnormality.   Social History: He is Originally from [place], moved to the [place] in 1998. He is married. He has 3 boys and 2 daughters.  Non-smoker.  No alcohol or drug use.  Family History: No family history of esophageal, gastric or colon cancer.  Both parents are deceased, etiology of deaths unknown.  Allergies  Allergen Reactions   Naproxen Other (See Comments)    Stomach Aches      Outpatient Encounter Medications as of 03/04/2022  Medication Sig   cyclobenzaprine (FLEXERIL) 10 MG tablet Take 1 tablet (10 mg total) by mouth 2 (two) times daily as needed for muscle spasms.   oxycodone (OXY-IR) 5 MG capsule Take 1 capsule (5 mg total) by mouth every 6 (six) hours as needed for pain.   No facility-administered encounter medications on file as of 03/04/2022.    REVIEW OF SYSTEMS:  Gen: Denies fever, sweats or chills. No weight loss.  CV: Denies chest pain, palpitations or edema. Resp: Denies cough, shortness of breath of hemoptysis.  GI: See HPI.   GU : Denies urinary burning, blood in urine, increased urinary frequency or incontinence. MS: Denies joint pain, muscles  aches or weakness. Derm: Denies rash, itchiness, skin lesions or unhealing ulcers. Psych: Denies depression, anxiety or memory loss. Heme: Denies bruising, easy bleeding. Neuro:  Denies headaches, dizziness or paresthesias. Endo:  Denies any problems with DM, thyroid or adrenal function.  PHYSICAL EXAM: BP (!) 102/58   Pulse 62   Ht 5\' 5"  (1.651 m)   Wt 165 lb (74.8 kg)   BMI 27.46 kg/m   General: 51 year old French-speaking  male in no acute distress. Head: Normocephalic and atraumatic. Eyes:  Sclerae non-icteric, conjunctive pink. Ears: Normal auditory acuity. Mouth: Dentition intact. No ulcers or lesions.  Neck: Supple, no lymphadenopathy or thyromegaly.  Lungs: Clear bilaterally to auscultation without wheezes, crackles or rhonchi. Heart: Regular rate and rhythm. No murmur, rub or gallop appreciated.  Abdomen: Soft, nontender, non distended. No masses. No hepatosplenomegaly. Normoactive bowel sounds x 4 quadrants.  Rectal:  Musculoskeletal: Symmetrical with no gross deformities. Skin: Warm and dry. No rash or lesions on visible extremities. Extremities: No edema. Neurological: Alert oriented x 4, no focal deficits.  Psychological:  Alert and cooperative. Normal mood and affect.  ASSESSMENT AND PLAN:  50) 51 year old male with chronic RUQ pain x 6 to 7 years with a 1.2 cm gallstone in the neck of the gallbladder per RUQ sono 02/2021. No GERD symptoms. Infrequent NSAID use.  -Request copy of CBC, CBC completed by his PCP 02/12/2022 -Consider repeat abdominal sonogram to reevaluate status of previously seen gallstone -EGD to rule out PUD/H.pylori/UGI malignancy benefits and risks discussed including risk with sedation, risk of bleeding, perforation and infection  -Referral to general surgery after EGD completed or if symptoms worsen -Avoid fatty foods -Pantoprazole 40 mg 1 p.o. daily  2) Colon cancer screening -Screening colonoscopy recommended at time of EGD benefits and risks discussed including risk with sedation, risk of bleeding, perforation and infection   Patient did not wish to schedule an EGD and colonoscopy at this time, he wishes to discuss further with his wife. Will contact our office when he is ready to schedule these procedures.  Patient is uninsured.  Patient was provided with [facility] health financial assistance application.       CC:  [facility], PA-C

## 2022-03-10 NOTE — Progress Notes (Signed)
Agree with assessment/plan.  Raj Kishana Battey, MD [facility] [phone]  

## 2024-02-28 NOTE — Progress Notes (Deleted)
 [facility]   [phone] Fax:([phone]) [phone]   Clinic New consult Note   Patient Care Team: Patient, No Pcp Per as PCP - General (General Practice) 02/28/2024  CHIEF COMPLAINTS/PURPOSE OF CONSULTATION:  Other neutropenia    HISTORY OF PRESENTING ILLNESS:  Nathaniel Cruz 53 y.o. male is here because of *** anemia.  ***He was found to have abnormal CBC from *** ***He denies recent chest pain on exertion, shortness of breath on minimal exertion, pre-syncopal episodes, or palpitations. ***He had not noticed any recent bleeding such as epistaxis, hematuria or hematochezia ***The patient denies over the counter NSAID ingestion. He is not *** on antiplatelets agents. His last colonoscopy was *** ***He had no prior history or diagnosis of cancer. His age appropriate screening programs are up-to-date. ***He denies any pica and eats a variety of diet. ***He never donated blood or received blood transfusion ***The patient was prescribed oral iron supplements and he takes ***   REVIEW OF SYSTEMS:   Constitutional: Denies fevers, chills or abnormal night sweats Eyes: Denies blurriness of vision, double vision or watery eyes Ears, nose, mouth, throat, and face: Denies mucositis or sore throat Respiratory: Denies cough, dyspnea or wheezes Cardiovascular: Denies palpitation, chest discomfort or lower extremity swelling Gastrointestinal:  Denies nausea, heartburn or change in bowel habits Skin: Denies abnormal skin rashes Lymphatics: Denies new lymphadenopathy or easy bruising Neurological:Denies numbness, tingling or new weaknesses Behavioral/Psych: Mood is stable, no new changes   All other systems were reviewed with the patient and are negative.   MEDICAL HISTORY:  No past medical history on file.  SURGICAL HISTORY: No past surgical history on file.  SOCIAL HISTORY: Social History   Socioeconomic History   Marital status: Single    Spouse name: Not on  file   Number of children: Not on file   Years of education: Not on file   Highest education level: Not on file  Occupational History   Not on file  Tobacco Use   Smoking status: Never   Smokeless tobacco: Never  Vaping Use   Vaping status: Never Used  Substance and Sexual Activity   Alcohol use: No   Drug use: No   Sexual activity: Never  Other Topics Concern   Not on file  Social History Narrative   Not on file   Social Drivers of Health   Tobacco Use: Low Risk (03/04/2022)   Patient History    Smoking Tobacco Use: Never    Smokeless Tobacco Use: Never    Passive Exposure: Not on file  Financial Resource Strain: Not on file  Food Insecurity: Not on file  Transportation Needs: Not on file  Physical Activity: Not on file  Stress: Not on file  Social Connections: Not on file  Intimate Partner Violence: Not on file  Depression (EYV7-0): Not on file  Alcohol Screen: Not on file  Housing: Not on file  Utilities: Not on file  Health Literacy: Not on file    FAMILY HISTORY: No family history on file.  ALLERGIES:  is allergic to naproxen.  MEDICATIONS:  Current Outpatient Medications  Medication Sig Dispense Refill   pantoprazole  (PROTONIX ) 40 MG tablet Take 1 tablet (40 mg total) by mouth daily. 30 tablet 1   No current facility-administered medications for this visit.    PHYSICAL EXAMINATION: ECOG PERFORMANCE STATUS: {CHL ONC ECOG PS:[id number]}  There were no vitals filed for this visit. There were no vitals filed for this visit.  GENERAL:alert, no distress and comfortable SKIN:  skin color, texture, turgor are normal, no rashes or significant lesions EYES: normal, conjunctiva are pink and non-injected, sclera clear OROPHARYNX:no exudate, no erythema and lips, buccal mucosa, and tongue normal  NECK: supple, thyroid normal size, non-tender, without nodularity LYMPH:  no palpable lymphadenopathy in the cervical, axillary or inguinal LUNGS: clear to  auscultation and percussion with normal breathing effort HEART: regular rate & rhythm and no murmurs and no lower extremity edema ABDOMEN:abdomen soft, non-tender and normal bowel sounds Musculoskeletal:no cyanosis of digits and no clubbing  PSYCH: alert & oriented x 3 with fluent speech NEURO: no focal motor/sensory deficits  LABORATORY DATA:  I have reviewed the data as listed    Latest Ref Rng & Units 09/21/2013    1:00 PM 04/18/2012   11:51 AM  CBC  WBC 4.0 - 10.5 K/uL 4.1    Hemoglobin 13.0 - 17.0 g/dL 86.0  84.6   Hematocrit 39.0 - 52.0 % 40.0  45.0   Platelets 150 - 400 K/uL 173         Latest Ref Rng & Units 09/21/2013    1:00 PM 04/18/2012   11:51 AM  CMP  Glucose 70 - 99 mg/dL 99  92   BUN 6 - 23 mg/dL 9  15   Creatinine 9.49 - 1.35 mg/dL 9.05  8.89   Sodium 864 - 145 mEq/L 138  141   Potassium 3.5 - 5.3 mEq/L 4.7  3.9   Chloride 96 - 112 mEq/L 105  104   CO2 19 - 32 mEq/L 26    Calcium 8.4 - 10.5 mg/dL 9.2    Total Protein 6.0 - 8.3 g/dL 7.1    Total Bilirubin 0.2 - 1.2 mg/dL 0.9    Alkaline Phos 39 - 117 U/L 68    AST 0 - 37 U/L 21    ALT 0 - 53 U/L 18       RADIOGRAPHIC STUDIES: I have personally reviewed the radiological images as listed and agreed with the findings in the report. No results found.  No orders of the defined types were placed in this encounter.   All questions were answered. The patient knows to call the clinic with any problems, questions or concerns. The total time spent in the appointment was {CHL ONC TIME VISIT - DTPQU:[id number]}.     Powell FORBES Lessen, NP 02/28/2024 4:53 PM  I, Izetta Neither, am acting as scribe for Onita Mattock, MD.   {Add scribe attestation statement}

## 2024-03-02 ENCOUNTER — Inpatient Hospital Stay: Payer: Self-pay

## 2024-03-02 ENCOUNTER — Inpatient Hospital Stay: Payer: Self-pay | Admitting: Nurse Practitioner

## 2024-04-11 NOTE — Progress Notes (Unsigned)
 "  [facility]  [phone]   HEMATOLOGY ONCOLOGY CONSULTATION   Nathaniel Cruz  DOB: 1970-08-18  MR#: [id number]  CSN#: [id number]    Requesting Physician: Adrianna Crooked, MD  Patient Care Team: Patient, No Pcp Per as PCP - General (General Practice)  Reason for consult: Mild neutropenia  History of present illness:   Patient has medical history distant with hyperlipidemia and arthritic disorder.  She has also had right-sided headache with pressure in the right eye.  Tenderness over the temporal area with concern for migraines, cluster headaches, trigeminal neuralgia, or increased intraocular pressure.  Temporal arteritis was also a concern. Labs done on date of primary care evaluation (01/12/2024) showed mild neutropenia.  Her WBC was 4.0 with ANC of 1.2.  Labs prior to that were done 07/16/2023.  At that time, WBC was 4.5 with ANC of 1.6 on 02/13/2022, WBC was 4.5 and ANC normal at 1.5.  On 02/10/2021, WBC was 5.0 and ANC was 1.2.  She was referred to hematology for evaluation of mild neutropenia.  Reviewing historical labs from 09/21/2013, ANC was 1.2 with normal WBC of 4.1. MEDICAL HISTORY:  No past medical history on file.  SURGICAL HISTORY: No past surgical history on file.  SOCIAL HISTORY: Social History   Socioeconomic History   Marital status: Single    Spouse name: Not on file   Number of children: Not on file   Years of education: Not on file   Highest education level: Not on file  Occupational History   Not on file  Tobacco Use   Smoking status: Never   Smokeless tobacco: Never  Vaping Use   Vaping status: Never Used  Substance and Sexual Activity   Alcohol use: No   Drug use: No   Sexual activity: Never  Other Topics Concern   Not on file  Social History Narrative   Not on file   Social Drivers of Health   Tobacco Use: Low Risk (03/04/2022)   Patient History    Smoking Tobacco Use: Never    Smokeless Tobacco Use: Never     Passive Exposure: Not on file  Financial Resource Strain: Not on file  Food Insecurity: Not on file  Transportation Needs: Not on file  Physical Activity: Not on file  Stress: Not on file  Social Connections: Not on file  Intimate Partner Violence: Not on file  Depression (EYV7-0): Not on file  Alcohol Screen: Not on file  Housing: Not on file  Utilities: Not on file  Health Literacy: Not on file    FAMILY HISTORY: No family history on file.  ALLERGIES:  is allergic to naproxen.  MEDICATIONS:  Current Outpatient Medications  Medication Sig Dispense Refill   pantoprazole  (PROTONIX ) 40 MG tablet Take 1 tablet (40 mg total) by mouth daily. 30 tablet 1   No current facility-administered medications for this visit.    REVIEW OF SYSTEMS:   Constitutional: Denies fevers, chills or abnormal night sweats Eyes: Denies blurriness of vision, double vision or watery eyes Ears, nose, mouth, throat, and face: Denies mucositis or sore throat Respiratory: Denies cough, dyspnea or wheezes Cardiovascular: Denies palpitation, chest discomfort or lower extremity swelling Gastrointestinal:  Denies nausea, heartburn or change in bowel habits Skin: Denies abnormal skin rashes Lymphatics: Denies new lymphadenopathy or easy bruising Neurological:Denies numbness, tingling or new weaknesses Behavioral/Psych: Mood is stable, no new changes  All other systems were reviewed with the patient and are negative.  PHYSICAL EXAMINATION: ECOG PERFORMANCE STATUS: {CHL  ONC ECOG ED:[id number]}  There were no vitals filed for this visit. There were no vitals filed for this visit.  GENERAL:alert, no distress and comfortable SKIN: skin color, texture, turgor are normal, no rashes or significant lesions EYES: normal, conjunctiva are pink and non-injected, sclera clear OROPHARYNX:no exudate, no erythema and lips, buccal mucosa, and tongue normal  NECK: supple, thyroid normal size, non-tender, without  nodularity LYMPH:  no palpable lymphadenopathy in the cervical, axillary or inguinal LUNGS: clear to auscultation and percussion with normal breathing effort HEART: regular rate & rhythm and no murmurs and no lower extremity edema ABDOMEN:abdomen soft, non-tender and normal bowel sounds Musculoskeletal:no cyanosis of digits and no clubbing  PSYCH: alert & oriented x 3 with fluent speech NEURO: no focal motor/sensory deficits  LABORATORY DATA:  I have reviewed the data as listed Lab Results  Component Value Date   WBC 4.1 09/21/2013   HGB 13.9 09/21/2013   HCT 40.0 09/21/2013   MCV 79.7 09/21/2013   PLT 173 09/21/2013   No results for input(s): NA, K, CL, CO2, GLUCOSE, BUN, CREATININE, CALCIUM, GFRNONAA, GFRAA, PROT, ALBUMIN, AST, ALT, ALKPHOS, BILITOT, BILIDIR, IBILI in the last 8760 hours.  RADIOGRAPHIC STUDIES: I have personally reviewed the radiological images as listed and agreed with the findings in the report. No results found.  ASSESSMENT & PLAN:  ***  Recommendations:   All questions were answered. The patient knows to call the clinic with any problems, questions or concerns.      Powell FORBES Lessen, NP 04/11/2024 3:46 PM   "

## 2024-04-13 ENCOUNTER — Inpatient Hospital Stay: Payer: Self-pay | Admitting: Nurse Practitioner

## 2024-04-13 ENCOUNTER — Telehealth: Payer: Self-pay | Admitting: *Deleted

## 2024-04-13 ENCOUNTER — Inpatient Hospital Stay: Payer: Self-pay

## 2024-04-19 NOTE — Telephone Encounter (Signed)
 Open in error
